# Patient Record
Sex: Male | Born: 2000 | Race: Black or African American | Hispanic: No | Marital: Single
Health system: Southern US, Community
[De-identification: ages and names within clinical notes are randomized; demographics above are authoritative.]

## PROBLEM LIST (undated history)

## (undated) DIAGNOSIS — J45909 Unspecified asthma, uncomplicated: Secondary | ICD-10-CM

---
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
## 2022-02-23 ENCOUNTER — Emergency Department (HOSPITAL_COMMUNITY): Payer: Self-pay

## 2022-02-23 ENCOUNTER — Emergency Department (HOSPITAL_COMMUNITY)
Admission: EM | Admit: 2022-02-23 | Discharge: 2022-02-24 | Discharge status: Against Medical Advice | Payer: Self-pay | Attending: Student | Admitting: Student

## 2022-02-23 DIAGNOSIS — M79642 Pain in left hand: Secondary | ICD-10-CM | POA: Insufficient documentation

## 2022-02-23 DIAGNOSIS — M25569 Pain in unspecified knee: Secondary | ICD-10-CM | POA: Insufficient documentation

## 2022-02-23 DIAGNOSIS — M545 Low back pain, unspecified: Secondary | ICD-10-CM | POA: Insufficient documentation

## 2022-02-23 DIAGNOSIS — M79641 Pain in right hand: Secondary | ICD-10-CM | POA: Insufficient documentation

## 2022-02-23 DIAGNOSIS — Z5321 Procedure and treatment not carried out due to patient leaving prior to being seen by health care provider: Secondary | ICD-10-CM | POA: Insufficient documentation

## 2022-02-23 MED ORDER — TETANUS-DIPHTH-ACELL PERTUSSIS 5-2.5-18.5 LF-MCG/0.5 IM SUSY: 0.5000 mL | PREFILLED_SYRINGE | Freq: Once | INTRAMUSCULAR | Status: DC

## 2022-02-23 MED ORDER — ACETAMINOPHEN 325 MG PO TABS: 650.0000 mg | ORAL_TABLET | Freq: Once | ORAL | Status: DC
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
## 2022-02-24 NOTE — ED Notes (Signed)
Patient called x 2 with no response. 

## 2022-04-04 ENCOUNTER — Ambulatory Visit (HOSPITAL_COMMUNITY)
Admission: EM | Admit: 2022-04-04 | Discharge: 2022-04-04 | Disposition: A | Discharge status: Home / Self Care | Payer: Self-pay | Attending: Internal Medicine | Admitting: Internal Medicine

## 2022-04-04 ENCOUNTER — Encounter (HOSPITAL_COMMUNITY): Payer: Self-pay | Admitting: Emergency Medicine

## 2022-04-04 DIAGNOSIS — Z202 Contact with and (suspected) exposure to infections with a predominantly sexual mode of transmission: Secondary | ICD-10-CM

## 2022-04-04 DIAGNOSIS — Z113 Encounter for screening for infections with a predominantly sexual mode of transmission: Secondary | ICD-10-CM | POA: Insufficient documentation
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
## 2022-04-04 NOTE — Discharge Instructions (Addendum)
Your STD testing has been sent to the lab and will come back in the next 2 to 3 days.  We will call you if any of your results are positive requiring treatment and treat you at that time.   Avoid sexual intercourse until your STD results come back.  If any of your STD results are positive, you will need to avoid sexual intercourse for 7 days while you are being treated to prevent spread of STD.  Condom use is the best way to prevent spread of STDs.  Return to urgent care as needed.  

## 2022-04-04 NOTE — ED Triage Notes (Signed)
Pt reports a girl that has sexual intercourse with tested positive for an STD unsure which and needs testing. Denies any s/s

## 2022-04-07 LAB — CYTOLOGY, (ORAL, ANAL, URETHRAL) ANCILLARY ONLY
Chlamydia: NEGATIVE
Comment: NEGATIVE
Comment: NEGATIVE
Comment: NORMAL
Neisseria Gonorrhea: POSITIVE — AB
Trichomonas: NEGATIVE

## 2022-04-08 ENCOUNTER — Telehealth (HOSPITAL_COMMUNITY): Payer: Self-pay | Admitting: Emergency Medicine

## 2022-04-08 NOTE — Telephone Encounter (Signed)
Opened in error

## 2022-04-08 NOTE — Telephone Encounter (Signed)
Per protocol, patient will need treatment with IM Rocephin  500 mg for positive Gonorrhea Contacted patient by phone.  Verified identity using two identifiers.  Provided positive result.  Reviewed safe sex practices, notifying partners, and refraining from sexual activities for 7 days from time of treatment.  Patient verified understanding, all questions answered.   [facility] notified 

## 2022-05-16 ENCOUNTER — Encounter (HOSPITAL_COMMUNITY): Payer: Self-pay

## 2022-05-16 ENCOUNTER — Ambulatory Visit (HOSPITAL_COMMUNITY)
Admission: RE | Admit: 2022-05-16 | Discharge: 2022-05-16 | Disposition: A | Discharge status: Home / Self Care | Payer: Self-pay | Source: Ambulatory Visit | Attending: Internal Medicine | Admitting: Internal Medicine

## 2022-05-16 VITALS — BP 129/85 | HR 73 | Temp 98.2°F | Resp 18

## 2022-05-16 DIAGNOSIS — Z202 Contact with and (suspected) exposure to infections with a predominantly sexual mode of transmission: Secondary | ICD-10-CM | POA: Insufficient documentation

## 2022-05-16 DIAGNOSIS — A549 Gonococcal infection, unspecified: Secondary | ICD-10-CM

## 2022-05-16 MED ORDER — CEFTRIAXONE SODIUM 500 MG IJ SOLR
INTRAMUSCULAR | Status: AC
  Filled 2022-05-16: qty 500

## 2022-05-16 MED ORDER — CEFTRIAXONE SODIUM 500 MG IJ SOLR
500.0000 mg | INTRAMUSCULAR | Status: DC
  Administered 2022-05-16: 500 mg via INTRAMUSCULAR

## 2022-05-16 MED ORDER — LIDOCAINE HCL (PF) 1 % IJ SOLN
INTRAMUSCULAR | Status: AC
  Filled 2022-05-16: qty 2

## 2022-05-16 NOTE — ED Triage Notes (Signed)
Pt tested positive for Gonorrhea on 04/08/2022 and has not been treated. Denies sx.

## 2022-05-16 NOTE — ED Provider Notes (Signed)
[place]    CSN: [id number] Arrival date & time: 05/16/22  1600      History   Chief Complaint Chief Complaint  Patient presents with   SEXUALLY TRANSMITTED DISEASE    Entered by patient    HPI Eric Williams is a 22 y.o. male.   Patient presents urgent care for evaluation and treatment of gonorrhea.  Patient tested positive for gonorrhea in December 2023 but was never treated with Rocephin.  He states he has not been sexually active since positive test for gonorrhea and is not currently experiencing any symptoms. No recent new sexual partners or known exposure to STD. No penile discharge, dysuria, penile rash, penile itching, or urinary symptoms. No fever/chills or recent antibiotics.      Past Medical History:  Diagnosis Date   Asthma     There are no problems to display for this patient.   History reviewed. No pertinent surgical history.     Home Medications    Prior to Admission medications   Not on File    Family History History reviewed. No pertinent family history.  Social History Social History   Tobacco Use   Smoking status: Never   Smokeless tobacco: Never     Allergies   Patient has no known allergies.   Review of Systems Review of Systems Per HPI  Physical Exam Triage Vital Signs ED Triage Vitals [05/16/22 1732]  Enc Vitals Group     BP 129/85     Pulse Rate 73     Resp 18     Temp 98.2 F (36.8 C)     Temp Source Oral     SpO2 99 %     Weight      Height      Head Circumference      Peak Flow      Pain Score      Pain Loc      Pain Edu?      Excl. in [place]?    No data found.  Updated Vital Signs BP 129/85 (BP Location: Left Arm)   Pulse 73   Temp 98.2 F (36.8 C) (Oral)   Resp 18   SpO2 99%   Visual Acuity Right Eye Distance:   Left Eye Distance:   Bilateral Distance:    Right Eye Near:   Left Eye Near:    Bilateral Near:     Physical Exam Vitals and nursing note reviewed.   Constitutional:      Appearance: He is not ill-appearing or toxic-appearing.  HENT:     Head: Normocephalic and atraumatic.     Right Ear: Hearing and external ear normal.     Left Ear: Hearing and external ear normal.     Nose: Nose normal.     Mouth/Throat:     Lips: Pink.  Eyes:     General: Lids are normal. Vision grossly intact. Gaze aligned appropriately.     Extraocular Movements: Extraocular movements intact.     Conjunctiva/sclera: Conjunctivae normal.  Pulmonary:     Effort: Pulmonary effort is normal.  Genitourinary:    Comments: Deferred. Musculoskeletal:     Cervical back: Neck supple.  Skin:    General: Skin is warm and dry.     Capillary Refill: Capillary refill takes less than 2 seconds.     Findings: No rash.  Neurological:     General: No focal deficit present.     Mental Status: He is alert and oriented to  person, place, and time. Mental status is at baseline.     Cranial Nerves: No dysarthria or facial asymmetry.  Psychiatric:        Mood and Affect: Mood normal.        Speech: Speech normal.        Behavior: Behavior normal.        Thought Content: Thought content normal.        Judgment: Judgment normal.      UC Treatments / Results  Labs (all labs ordered are listed, but only abnormal results are displayed) Labs Reviewed  CYTOLOGY, (ORAL, ANAL, URETHRAL) ANCILLARY ONLY    EKG   Radiology No results found.  Procedures Procedures (including critical care time)  Medications Ordered in UC Medications  cefTRIAXone (ROCEPHIN) injection 500 mg (500 mg Intramuscular Given 05/16/22 1802)    Initial Impression / Assessment and Plan / UC Course  I have reviewed the triage vital signs and the nursing notes.  Pertinent labs & imaging results that were available during my care of the patient were reviewed by me and considered in my medical decision making (see chart for details).   STI labs pending, patient declines HIV and/or syphilis testing  today.  Ceftriaxone 500 mg given IM to treat gonorrhea. Will treat for all other positive results once STI labs result. Patient to abstain from sexual intercourse for 7 days while undergoing treatment. Education provided regarding safe sexual practices and patient encouraged to use protection to prevent spread of STIs.   Discussed physical exam and available lab work findings in clinic with patient.  Counseled patient regarding appropriate use of medications and potential side effects for all medications recommended or prescribed today. Discussed red flag signs and symptoms of worsening condition,when to call the PCP office, return to urgent care, and when to seek higher level of care in the emergency department. Patient verbalizes understanding and agreement with plan. All questions answered. Patient discharged in stable condition.    Final Clinical Impressions(s) / UC Diagnoses   Final diagnoses:  Gonorrhea  Possible exposure to STD     Discharge Instructions      You were tested today for STDs and the results are still pending; you have been given treatment for possible infections presumptively anyway due to your symptoms. You will receive a phone call in approximately 3 days if the results are positive. You should follow up with your primary care provider for further STI testing.  Avoid sexual intercourse for 7 days. Advise your sexual partner(s) to be evaluated, tested and treated. This includes all sexual partners within the past 60 days or your last sexual partner if last contact was greater than 60 days.  To minimize the risk of reinfection, you should abstain from sexual intercourse until your sexual partners have been tested and treated.   Consistent condom use is important in preventing the spread of sexually transmitted infections.  We will treat for any other positive results when your labs come back. If you do not hear from [place], this means that your STI testing was negative or  there is no change to your treatment plan. You will also receive these results via [place].   Return if you experience fevers 100.4 or greater, worsening or uncontrolled pain, rashes, sores, vomiting, or for any other concerning symptoms.     ED Prescriptions   None    PDMP not reviewed this encounter.   Talbot Grumbling, [place] 05/16/22 1825

## 2022-05-16 NOTE — Discharge Instructions (Signed)
You were tested today for STDs and the results are still pending; you have been given treatment for possible infections presumptively anyway due to your symptoms. You will receive a phone call in approximately 3 days if the results are positive. You should follow up with your primary care provider for further STI testing.  Avoid sexual intercourse for 7 days. Advise your sexual partner(s) to be evaluated, tested and treated. This includes all sexual partners within the past 60 days or your last sexual partner if last contact was greater than 60 days.  To minimize the risk of reinfection, you should abstain from sexual intercourse until your sexual partners have been tested and treated.   Consistent condom use is important in preventing the spread of sexually transmitted infections.  We will treat for any other positive results when your labs come back. If you do not hear from us, this means that your STI testing was negative or there is no change to your treatment plan. You will also receive these results via MyChart.   Return if you experience fevers 100.4 or greater, worsening or uncontrolled pain, rashes, sores, vomiting, or for any other concerning symptoms.   

## 2022-05-21 ENCOUNTER — Telehealth (HOSPITAL_COMMUNITY): Payer: Self-pay | Admitting: Emergency Medicine

## 2022-05-21 LAB — CYTOLOGY, (ORAL, ANAL, URETHRAL) ANCILLARY ONLY
Chlamydia: POSITIVE — AB
Comment: NEGATIVE
Comment: NEGATIVE
Comment: NORMAL
Neisseria Gonorrhea: NEGATIVE
Trichomonas: NEGATIVE

## 2022-05-21 MED ORDER — DOXYCYCLINE HYCLATE 100 MG PO CAPS: 100.0000 mg | ORAL_CAPSULE | Freq: Two times a day (BID) | ORAL | 0 refills | Status: AC

## 2023-02-14 ENCOUNTER — Emergency Department (HOSPITAL_COMMUNITY): Payer: Self-pay

## 2023-02-14 ENCOUNTER — Emergency Department (HOSPITAL_COMMUNITY)
Admission: EM | Admit: 2023-02-14 | Discharge: 2023-02-14 | Disposition: A | Discharge status: Home / Self Care | Payer: Self-pay | Attending: Emergency Medicine | Admitting: Emergency Medicine

## 2023-02-14 ENCOUNTER — Other Ambulatory Visit (HOSPITAL_COMMUNITY): Payer: Self-pay

## 2023-02-14 DIAGNOSIS — S1093XA Contusion of unspecified part of neck, initial encounter: Secondary | ICD-10-CM | POA: Insufficient documentation

## 2023-02-14 DIAGNOSIS — S0012XA Contusion of left eyelid and periocular area, initial encounter: Secondary | ICD-10-CM | POA: Insufficient documentation

## 2023-02-14 DIAGNOSIS — S025XXA Fracture of tooth (traumatic), initial encounter for closed fracture: Secondary | ICD-10-CM | POA: Insufficient documentation

## 2023-02-14 DIAGNOSIS — F10920 Alcohol use, unspecified with intoxication, uncomplicated: Secondary | ICD-10-CM

## 2023-02-14 DIAGNOSIS — S00531A Contusion of lip, initial encounter: Secondary | ICD-10-CM | POA: Insufficient documentation

## 2023-02-14 DIAGNOSIS — F10120 Alcohol abuse with intoxication, uncomplicated: Secondary | ICD-10-CM | POA: Insufficient documentation

## 2023-02-14 NOTE — Discharge Instructions (Signed)
Your scans did not show any internal injuries. You do have a broken tooth so you will need to follow-up with dentist about this. Can take tylenol or motrin as needed for pain.

## 2023-02-14 NOTE — ED Notes (Signed)
Pt sleeping/ resp even and regular  
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
## 2023-02-14 NOTE — ED Notes (Signed)
Pt given bus pass ?

## 2023-02-14 NOTE — ED Notes (Signed)
Patient transported to CT 
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
## 2023-09-04 ENCOUNTER — Ambulatory Visit (HOSPITAL_COMMUNITY)
Admission: EM | Admit: 2023-09-04 | Discharge: 2023-09-04 | Disposition: A | Discharge status: Home / Self Care | Payer: Self-pay | Attending: Family Medicine | Admitting: Family Medicine

## 2023-09-04 ENCOUNTER — Encounter (HOSPITAL_COMMUNITY): Payer: Self-pay

## 2023-09-04 ENCOUNTER — Ambulatory Visit (INDEPENDENT_AMBULATORY_CARE_PROVIDER_SITE_OTHER): Payer: Self-pay

## 2023-09-04 ENCOUNTER — Other Ambulatory Visit: Payer: Self-pay

## 2023-09-04 DIAGNOSIS — S62352A Nondisplaced fracture of shaft of third metacarpal bone, right hand, initial encounter for closed fracture: Secondary | ICD-10-CM

## 2023-09-04 DIAGNOSIS — M542 Cervicalgia: Secondary | ICD-10-CM

## 2023-09-04 DIAGNOSIS — M79641 Pain in right hand: Secondary | ICD-10-CM

## 2023-09-04 MED ORDER — TIZANIDINE HCL 4 MG PO TABS: 4.0000 mg | ORAL_TABLET | Freq: Three times a day (TID) | ORAL | 0 refills | Status: DC | PRN
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
## 2023-09-04 NOTE — Progress Notes (Signed)
 Orthopedic Tech Progress Note Patient Details:  Eric Williams 04/30/2000 [id number]  Ortho Devices Type of Ortho Device: Rad Gutter splint Ortho Device/Splint Location: RLE Ortho Device/Splint Interventions: Ordered, Application, Adjustment   Post Interventions Patient Tolerated: Well  Martika Egler OTR/L 09/04/2023, 3:47 PM
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
## 2023-10-25 ENCOUNTER — Other Ambulatory Visit: Payer: Self-pay

## 2023-10-25 ENCOUNTER — Emergency Department (HOSPITAL_COMMUNITY)
Admission: EM | Admit: 2023-10-25 | Discharge: 2023-10-25 | Disposition: A | Discharge status: Home / Self Care | Payer: Self-pay | Attending: Emergency Medicine | Admitting: Emergency Medicine

## 2023-10-25 DIAGNOSIS — J45909 Unspecified asthma, uncomplicated: Secondary | ICD-10-CM | POA: Insufficient documentation

## 2023-10-25 DIAGNOSIS — M542 Cervicalgia: Secondary | ICD-10-CM | POA: Insufficient documentation

## 2023-10-25 MED ORDER — METHOCARBAMOL 500 MG PO TABS: 500.0000 mg | ORAL_TABLET | Freq: Two times a day (BID) | ORAL | 0 refills | Status: DC

## 2023-10-25 NOTE — Discharge Instructions (Addendum)
 You were evaluated in the emergency room for back pain.  Prescription for Robaxin, muscle relaxer was sent into your pharmacy.  Please avoid driving or operating heavy machinery while using this medication.  Follow-up with orthopedics.

## 2023-10-25 NOTE — ED Triage Notes (Addendum)
 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 He also states he works in cold conditions voices his job needs a note saying his labor can be reduced.
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
## 2023-11-05 ENCOUNTER — Other Ambulatory Visit: Payer: Self-pay

## 2023-11-05 ENCOUNTER — Emergency Department (HOSPITAL_COMMUNITY)
Admission: EM | Admit: 2023-11-05 | Discharge: 2023-11-05 | Disposition: A | Discharge status: Home / Self Care | Payer: Self-pay

## 2023-11-05 DIAGNOSIS — M545 Low back pain, unspecified: Secondary | ICD-10-CM

## 2023-11-05 DIAGNOSIS — M549 Dorsalgia, unspecified: Secondary | ICD-10-CM | POA: Insufficient documentation

## 2023-11-05 MED ORDER — LIDOCAINE 5 % EX PTCH: 1.0000 | MEDICATED_PATCH | CUTANEOUS | 0 refills | Status: AC
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
## 2023-11-05 NOTE — ED Provider Notes (Signed)
  [place] EMERGENCY DEPARTMENT AT [facility] Provider Note   CSN: [id number] Arrival date & time: 11/05/23  1051     Patient presents with: Back Pain   Eric Williams is a 23 y.o. male.   Is a 23 year old male presenting emergency department for back pain.  Seen a week ago for the same.  Reports symptoms have largely improved, and that he needs a return to work note.  No numbness tingling changes in sensation.   Back Pain      Prior to Admission medications   Medication Sig Start Date End Date Taking? Authorizing Provider  methocarbamol  (ROBAXIN ) 500 MG tablet Take 1 tablet (500 mg total) by mouth 2 (two) times daily. 10/25/23   Donnajean Lynwood DEL, PA-C  tiZANidine  (ZANAFLEX ) 4 MG tablet Take 1 tablet (4 mg total) by mouth every 8 (eight) hours as needed. 09/04/23   Darral Longs, MD    Allergies: Patient has no known allergies.    Review of Systems  Musculoskeletal:  Positive for back pain.    Updated Vital Signs BP 107/77 (BP Location: Right Arm)   Pulse 95   Temp 98.8 F (37.1 C) (Oral)   Resp 17   SpO2 97%   Physical Exam Vitals and nursing note reviewed.  Constitutional:      General: He is not in acute distress.    Appearance: He is not toxic-appearing.  HENT:     Head: Normocephalic.     Nose: Nose normal.  Eyes:     Conjunctiva/sclera: Conjunctivae normal.  Cardiovascular:     Rate and Rhythm: Normal rate and regular rhythm.  Pulmonary:     Effort: Pulmonary effort is normal.  Abdominal:     General: Abdomen is flat.     Palpations: Abdomen is soft.  Musculoskeletal:     Comments: Amatory, able to walk on toes and on heels.  Normal sensation.  No midline spinal tenderness  Skin:    Capillary Refill: Capillary refill takes less than 2 seconds.  Neurological:     Mental Status: He is alert and oriented to person, place, and time.  Psychiatric:        Mood and Affect: Mood normal.        Behavior: Behavior normal.     (all labs  ordered are listed, but only abnormal results are displayed) Labs Reviewed - No data to display  EKG: None  Radiology: No results found.   Procedures   Medications Ordered in the ED - No data to display                                  Medical Decision Making Is a 23 year old male presenting emergency department for return to work note; was given a week off from last ED visit.  He is afebrile nontachycardic hemodynamically stable.  Symptoms of largely resolved and is feeling improved, still has some nagging symptoms if he overexerts himself.  However, he is requesting to return to work he may need a doctor's note for him to do so.  He has no red flag symptoms on history or physical.  Will discharge in stable condition.      Final diagnoses:  None    ED Discharge Orders     None          Neysa Caron PARAS, DO 11/05/23 1130

## 2023-11-05 NOTE — Discharge Instructions (Signed)
 You may take Tylenol  alternate with ibuprofen  for your low back pain.  You may also use the muscle relaxer that was previously prescribed to you.  I am prescribing lidocaine  patches as well.  Please follow-up with your primary doctor.  Return immediately felt fevers, chills, severe pain, weakness in your lower extremities, difficulty voiding or any new or worsening symptoms that are concerning to you.

## 2023-11-18 ENCOUNTER — Encounter (HOSPITAL_COMMUNITY): Payer: Self-pay

## 2023-11-18 ENCOUNTER — Emergency Department (HOSPITAL_COMMUNITY)
Admission: EM | Admit: 2023-11-18 | Discharge: 2023-11-18 | Disposition: A | Discharge status: Home / Self Care | Payer: Self-pay | Attending: Emergency Medicine | Admitting: Emergency Medicine

## 2023-11-18 ENCOUNTER — Other Ambulatory Visit: Payer: Self-pay

## 2023-11-18 ENCOUNTER — Emergency Department (HOSPITAL_COMMUNITY): Payer: Self-pay

## 2023-11-18 DIAGNOSIS — S40012A Contusion of left shoulder, initial encounter: Secondary | ICD-10-CM | POA: Insufficient documentation

## 2023-11-18 MED ORDER — METHOCARBAMOL 500 MG PO TABS
1000.0000 mg | ORAL_TABLET | Freq: Once | ORAL | Status: AC
  Administered 2023-11-18: 1000 mg via ORAL
  Filled 2023-11-18: qty 2

## 2023-11-18 MED ORDER — IBUPROFEN 200 MG PO TABS
600.0000 mg | ORAL_TABLET | Freq: Once | ORAL | Status: AC
  Administered 2023-11-18: 600 mg via ORAL
  Filled 2023-11-18: qty 3
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
## 2023-11-18 NOTE — Discharge Instructions (Signed)
 It is recommended that you take ibuprofen  600 mg (3 of the over-the-counter strength tablets) every 6 hours. Cool compresses can help with soreness.
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
## 2023-11-20 ENCOUNTER — Ambulatory Visit (HOSPITAL_COMMUNITY)
Admission: EM | Admit: 2023-11-20 | Discharge: 2023-11-20 | Disposition: A | Discharge status: Home / Self Care | Payer: Self-pay | Attending: Physician Assistant | Admitting: Physician Assistant

## 2023-11-20 ENCOUNTER — Encounter (HOSPITAL_COMMUNITY): Payer: Self-pay | Admitting: Emergency Medicine

## 2023-11-20 DIAGNOSIS — M546 Pain in thoracic spine: Secondary | ICD-10-CM

## 2023-11-20 DIAGNOSIS — G8929 Other chronic pain: Secondary | ICD-10-CM

## 2023-11-20 DIAGNOSIS — S40212A Abrasion of left shoulder, initial encounter: Secondary | ICD-10-CM

## 2023-11-20 DIAGNOSIS — S40012A Contusion of left shoulder, initial encounter: Secondary | ICD-10-CM

## 2023-11-20 MED ORDER — MUPIROCIN 2 % EX OINT: 1.0000 | TOPICAL_OINTMENT | Freq: Two times a day (BID) | CUTANEOUS | 0 refills | Status: AC

## 2023-11-20 MED ORDER — METHOCARBAMOL 500 MG PO TABS: 500.0000 mg | ORAL_TABLET | Freq: Two times a day (BID) | ORAL | 0 refills | Status: AC | PRN

## 2023-11-20 NOTE — ED Triage Notes (Addendum)
 Pt reports that was seen at [facility] for his back problems on 7/23. Reports was given a note for work but not able to type in restrictions so they had to hand write his restrictions on the letter. Pt reports that his work is requiring a [employer] on note or another note with a stamp on it. Reports he can't Gisele to [facility] to get a stamp on the note he received from them on 7/23.   Pt reports back pain is worse with movement that he has to do at work. Reports he has ran out of muscle relaxer that was prescribed.

## 2023-11-20 NOTE — Discharge Instructions (Addendum)
 Take Robaxin  up to twice a day.  This will make you sleepy so do not drive or drink alcohol taking this medication.  You can use Tylenol  ibuprofen  over-the-counter for pain relief.  Keep the abrasion on your shoulder clean and apply Bactroban ointment up to twice a day.  If there is any redness, swelling, drainage you need to be reevaluated.  Please follow-up with sports medicine to soon as possible.  I have provided you a work excuse note with some restrictions but as we discussed, I am unable to provide any paperwork or complete things such as FMLA so if your job requires this you would need to follow-up with the specialist as we discussed.  If anything worsens please return for reevaluation.
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
## 2023-12-29 ENCOUNTER — Encounter (HOSPITAL_COMMUNITY): Payer: Self-pay | Admitting: *Deleted

## 2023-12-29 ENCOUNTER — Ambulatory Visit (HOSPITAL_COMMUNITY)
Admission: EM | Admit: 2023-12-29 | Discharge: 2023-12-29 | Disposition: A | Discharge status: Home / Self Care | Payer: Self-pay

## 2023-12-29 DIAGNOSIS — Z0289 Encounter for other administrative examinations: Secondary | ICD-10-CM

## 2023-12-29 NOTE — ED Triage Notes (Addendum)
 Pt needs a note to return to full duty. Has been on light duty since last OV pt has not followed up with sports med since she doesn't have insurance

## 2023-12-29 NOTE — ED Provider Notes (Signed)
 [facility]    CSN: [id number] Arrival date & time: 12/29/23  1508      History   Chief Complaint Chief Complaint  Patient presents with   Letter for School/Work    HPI Eric Williams is a 23 y.o. male.   Patient presents requesting work note that states that he no longer has restrictions.  Patient states last time he was seen here he was given a note stating that he has lifting restrictions.  Patient states that he is no longer having difficulty with back pain and would like to return to work without light duty.  Patient states that he never did follow-up with sports medicine as recommended due to not having insurance.  Patient denies any concerns today.  The history is provided by the patient and medical records.    Past Medical History:  Diagnosis Date   Asthma     There are no active problems to display for this patient.   History reviewed. No pertinent surgical history.     Home Medications    Prior to Admission medications   Medication Sig Start Date End Date Taking? Authorizing Provider  lidocaine  (LIDODERM ) 5 % Place 1 patch onto the skin daily. Remove & Discard patch within 12 hours or as directed by MD 11/05/23   Neysa Caron PARAS, DO  methocarbamol  (ROBAXIN ) 500 MG tablet Take 1 tablet (500 mg total) by mouth 2 (two) times daily as needed for muscle spasms. 11/20/23   Raspet, Erin K, PA-C  mupirocin  ointment (BACTROBAN ) 2 % Apply 1 Application topically 2 (two) times daily. 11/20/23   Raspet, Rocky POUR, PA-C    Family History History reviewed. No pertinent family history.  Social History Social History   Tobacco Use   Smoking status: Never   Smokeless tobacco: Never  Vaping Use   Vaping status: Never Used  Substance Use Topics   Alcohol use: Not Currently   Drug use: Not Currently     Allergies   Patient has no known allergies.   Review of Systems Review of Systems  Per HPI  Physical Exam Triage Vital Signs ED Triage Vitals   Encounter Vitals Group     BP 12/29/23 1633 114/82     Girls Systolic BP Percentile --      Girls Diastolic BP Percentile --      Boys Systolic BP Percentile --      Boys Diastolic BP Percentile --      Pulse Rate 12/29/23 1633 84     Resp 12/29/23 1633 16     Temp 12/29/23 1633 98.1 F (36.7 C)     Temp Source 12/29/23 1633 Oral     SpO2 12/29/23 1633 97 %     Weight --      Height --      Head Circumference --      Peak Flow --      Pain Score 12/29/23 1632 0     Pain Loc --      Pain Education --      Exclude from Growth Chart --    No data found.  Updated Vital Signs BP 114/82 (BP Location: Left Arm)   Pulse 84   Temp 98.1 F (36.7 C) (Oral)   Resp 16   SpO2 97%   Visual Acuity Right Eye Distance:   Left Eye Distance:   Bilateral Distance:    Right Eye Near:   Left Eye Near:    Bilateral Near:  Physical Exam Vitals and nursing note reviewed.  Constitutional:      General: He is awake. He is not in acute distress.    Appearance: Normal appearance. He is well-developed and well-groomed. He is not ill-appearing.  Musculoskeletal:     Cervical back: Normal.     Thoracic back: Normal.     Lumbar back: Normal.  Neurological:     Mental Status: He is alert.  Psychiatric:        Behavior: Behavior is cooperative.      UC Treatments / Results  Labs (all labs ordered are listed, but only abnormal results are displayed) Labs Reviewed - No data to display  EKG   Radiology No results found.  Procedures Procedures (including critical care time)  Medications Ordered in UC Medications - No data to display  Initial Impression / Assessment and Plan / UC Course  I have reviewed the triage vital signs and the nursing notes.  Pertinent labs & imaging results that were available during my care of the patient were reviewed by me and considered in my medical decision making (see chart for details).     Patient is overall well-appearing.  Vitals are  stable.  No findings on exam today.  Provided patient with return to work note as requested.  Discussed follow-up and return precautions. Final Clinical Impressions(s) / UC Diagnoses   Final diagnoses:  Encounter to obtain excuse from work     Discharge Instructions      I have provided you with a note to return to work as requested.  This is attached to the back of your paperwork.   ED Prescriptions   None    PDMP not reviewed this encounter.   Johnie Flaming A, NP 12/29/23 1659

## 2023-12-29 NOTE — Discharge Instructions (Signed)
 I have provided you with a note to return to work as requested.  This is attached to the back of your paperwork.
